# Patient Record
Sex: Female | Born: 2005 | Race: White | Hispanic: No | Marital: Single | Smoking: Never smoker
Health system: Southern US, Community
[De-identification: ages and names within clinical notes are randomized; demographics above are authoritative.]

## PROBLEM LIST (undated history)

## (undated) DIAGNOSIS — R5383 Other fatigue: Secondary | ICD-10-CM

## (undated) DIAGNOSIS — R42 Dizziness and giddiness: Secondary | ICD-10-CM

## (undated) DIAGNOSIS — R110 Nausea: Secondary | ICD-10-CM

## (undated) DIAGNOSIS — R200 Anesthesia of skin: Secondary | ICD-10-CM

## (undated) DIAGNOSIS — K648 Other hemorrhoids: Secondary | ICD-10-CM

## (undated) DIAGNOSIS — K0510 Chronic gingivitis, plaque induced: Secondary | ICD-10-CM

## (undated) DIAGNOSIS — R55 Syncope and collapse: Secondary | ICD-10-CM

## (undated) DIAGNOSIS — R480 Dyslexia and alexia: Secondary | ICD-10-CM

## (undated) DIAGNOSIS — K0889 Other specified disorders of teeth and supporting structures: Secondary | ICD-10-CM

## (undated) DIAGNOSIS — Z68.41 Body mass index (BMI) pediatric, 5th percentile to less than 85th percentile for age: Secondary | ICD-10-CM

## (undated) HISTORY — DX: Dyslexia and alexia: R48.0

## (undated) HISTORY — DX: Nausea: R11.0

## (undated) HISTORY — DX: Other hemorrhoids: K64.8

## (undated) HISTORY — DX: Paresthesia of skin: R20.0

## (undated) HISTORY — DX: Dizziness and giddiness: R42

## (undated) HISTORY — DX: Body mass index (BMI) pediatric, 5th percentile to less than 85th percentile for age: Z68.52

## (undated) HISTORY — DX: Other fatigue: R53.83

## (undated) HISTORY — DX: Syncope and collapse: R55

---
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
## 2024-01-06 ENCOUNTER — Encounter: Payer: Self-pay | Admitting: Gastroenterology

## 2024-01-06 ENCOUNTER — Ambulatory Visit: Payer: 59 | Admitting: Gastroenterology

## 2024-01-06 VITALS — BP 108/62 | HR 89 | Ht 68.0 in | Wt 135.4 lb

## 2024-01-06 DIAGNOSIS — K5900 Constipation, unspecified: Secondary | ICD-10-CM | POA: Diagnosis not present

## 2024-01-06 DIAGNOSIS — K625 Hemorrhage of anus and rectum: Secondary | ICD-10-CM | POA: Diagnosis not present

## 2024-01-06 MED ORDER — NA SULFATE-K SULFATE-MG SULF 17.5-3.13-1.6 GM/177ML PO SOLN: 1.0000 | Freq: Once | ORAL | 0 refills | Status: AC

## 2024-01-06 NOTE — Patient Instructions (Signed)
 You have been scheduled for a colonoscopy. Please follow written instructions given to you at your visit today.   Please pick up your prep supplies at the pharmacy within the next 1-3 days.  If you use inhalers (even only as needed), please bring them with you on the day of your procedure.  DO NOT TAKE 7 DAYS PRIOR TO TEST- Trulicity (dulaglutide) Ozempic, Wegovy (semaglutide) Mounjaro (tirzepatide) Bydureon Bcise (exanatide extended release)  DO NOT TAKE 1 DAY PRIOR TO YOUR TEST Rybelsus (semaglutide) Adlyxin (lixisenatide) Victoza (liraglutide) Byetta (exanatide) ______________________________________________________________________  _______________________________________________________  If your blood pressure at your visit was 140/90 or greater, please contact your primary care physician to follow up on this.  _______________________________________________________  If you are age 57 or older, your body mass index should be between 23-30. Your Body mass index is 20.58 kg/m. If this is out of the aforementioned range listed, please consider follow up with your Primary Care Provider.  If you are age 62 or younger, your body mass index should be between 19-25. Your Body mass index is 20.58 kg/m. If this is out of the aformentioned range listed, please consider follow up with your Primary Care Provider.   ________________________________________________________  The [facility] providers would like to encourage you to use MYCHART to communicate with providers for non-urgent requests or questions.  Due to long hold times on the telephone, sending your provider a message by [facility] may be a faster and more efficient way to get a response.  Please allow 48 business hours for a response.  Please remember that this is for non-urgent requests.  _______________________________________________________

## 2024-01-06 NOTE — Progress Notes (Addendum)
 01/06/2024 Yolanda Black [id number] 29-Jan-2006   HISTORY OF PRESENT ILLNESS: This is an 18 year old female who is new to our office.  She was referred here by Dr. Melvin for evaluation of bloody stool.  She is here today with her mother.  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  She tells me that she has been having issues with rectal bleeding since June.  Says that often times she has constipation with hard stools and has to sit for 5 minutes or so in order to go.  Since June she has had blood with every bowel movement, discomfort in her bottom, says that the blood is in the toilet bowl.  Her mother states that it looks like a advertising account executive.  Denies any abdominal pain.   Past Medical History:  Diagnosis Date   Gingivitis 01/2015   Tooth loose 02/02/2015   lower left   Past Surgical History:  Procedure Laterality Date   ALVEOLOPLASTY N/A 02/10/2015   Procedure: GINGIVECTOMY;  Surgeon: Aureliano Alm Maizes, DDS;  Location: [facility];  Service: Dentistry;  Laterality: N/A;    reports that she has never smoked. She has never used smokeless tobacco. She reports that she does not drink alcohol and does not use drugs. family history includes Asthma in her sister. Allergies  Allergen Reactions   Beeswax Hives      Outpatient Encounter Medications as of 01/06/2024  Medication Sig   fexofenadine (ALLEGRA ALLERGY) 180 MG tablet Take 180 mg by mouth as needed.   fluticasone (FLONASE ALLERGY RELIEF) 50 MCG/ACT nasal spray Place 1 spray into both nostrils as needed.   medroxyPROGESTERone  Acetate 150 MG/ML SUSY Inject 1 mL into the muscle as needed (every 3 months).   EPINEPHrine  (AUVI-Q ) 0.3 mg/0.3 mL IJ SOAJ injection Inject 0.3 mg into the muscle as needed. (Patient not taking: Reported on 01/06/2024)   mupirocin  ointment (BACTROBAN ) 2 % Apply 1 application topically 2 (two) times daily. (Patient not taking: Reported on 01/06/2024)   nystatin   cream (MYCOSTATIN ) Apply 1 application topically 3 (three) times daily. Use until completely resolved for < 48 hrs (Patient not taking: Reported on 01/06/2024)   [DISCONTINUED] fluconazole  (DIFLUCAN ) 10 MG/ML suspension Take 28.5 mLs (285 mg total) by mouth once. Repeat in 3 days. (Patient not taking: Reported on 01/06/2024)   [DISCONTINUED] trimethoprim -polymyxin b  (POLYTRIM ) ophthalmic solution Place 1 drop into the right eye every 4 (four) hours. X 7 days   No facility-administered encounter medications on file as of 01/06/2024.    REVIEW OF SYSTEMS  : All other systems reviewed and negative except where noted in the History of Present Illness.   PHYSICAL EXAM: BP 108/62   Pulse 89   Ht 5' 8 (1.727 m)   Wt 135 lb 6 oz (61.4 kg)   LMP 12/16/2023 (Within Days)   SpO2 98%   BMI 20.58 kg/m  General: Well developed white female in no acute distress Head: Normocephalic and atraumatic Eyes:  Sclerae anicteric, conjunctiva pink. Ears: Normal auditory acuity Lungs: Clear throughout to auscultation; no W/R/R. Heart: Regular rate and rhythm; no M/R/G. Abdomen: Soft, non-distended.  BS present.  Non-tender. Rectal:  Will be done at the time of colonoscopy. Musculoskeletal: Symmetrical with no gross deformities  Skin: No lesions on visible extremities Extremities: No edema  Neurological: Alert oriented x 4, grossly non-focal Psychological:  Alert and cooperative. Normal mood and affect  ASSESSMENT AND PLAN: *Rectal bleeding: Having hard,  painful bowel movements with associated rectal bleeding since June.  Every bowel movement has been bloody, blood in the toilet bowl, etc. since then.  Initially sounded like a fissure, but bleeding sounds more than what you expect from that.  Will plan for colonoscopy with Dr. Suzann next week.  Pending results likely will need treatment of her underlying constipation.  The risks, benefits, and alternatives to colonoscopy were discussed with the patient and she  consents to proceed.    CC:  Arlys Rogue, MD CC: Dr. Seena  I have reviewed the clinic note as outlined by Harlene Mail, PA and agree with the assessment, plan and medical decision making.  [name] has had rectal bleeding in conjunction with hemorrhoids since June 2024.  The differential diagnosis consists of anal fissure, hemorrhoids, proctitis, colon polyp, AVM.  Agree with proceeding with colonoscopy and further recommendations can be made pending that result.  Consider future labs including CBC and iron studies given protracted nature of bleeding.  Inocente Suzann, MD [facility]

## 2024-01-07 ENCOUNTER — Encounter: Payer: Self-pay | Admitting: Pediatrics

## 2024-01-12 ENCOUNTER — Ambulatory Visit (AMBULATORY_SURGERY_CENTER): Payer: 59 | Admitting: Pediatrics

## 2024-01-12 ENCOUNTER — Encounter: Payer: Self-pay | Admitting: Pediatrics

## 2024-01-12 VITALS — BP 110/60 | HR 73 | Temp 98.4°F | Resp 14 | Ht 68.0 in | Wt 135.0 lb

## 2024-01-12 DIAGNOSIS — D125 Benign neoplasm of sigmoid colon: Secondary | ICD-10-CM

## 2024-01-12 DIAGNOSIS — K648 Other hemorrhoids: Secondary | ICD-10-CM

## 2024-01-12 DIAGNOSIS — K6289 Other specified diseases of anus and rectum: Secondary | ICD-10-CM

## 2024-01-12 DIAGNOSIS — K635 Polyp of colon: Secondary | ICD-10-CM | POA: Diagnosis not present

## 2024-01-12 DIAGNOSIS — K625 Hemorrhage of anus and rectum: Secondary | ICD-10-CM | POA: Diagnosis not present

## 2024-01-12 DIAGNOSIS — K5900 Constipation, unspecified: Secondary | ICD-10-CM

## 2024-01-12 MED ORDER — SODIUM CHLORIDE 0.9 % IV SOLN: 500.0000 mL | Freq: Once | INTRAVENOUS | Status: DC

## 2024-01-12 NOTE — Patient Instructions (Addendum)
 Return to GI clinic as previously scheduled.  Await pathology  Please read over handouts provided   YOU HAD AN ENDOSCOPIC PROCEDURE TODAY AT THE [facility]:   Refer to the procedure report that was given to you for any specific questions about what was found during the examination.  If the procedure report does not answer your questions, please call your gastroenterologist to clarify.  If you requested that your care partner not be given the details of your procedure findings, then the procedure report has been included in a sealed envelope for you to review at your convenience later.  YOU SHOULD EXPECT: Some feelings of bloating in the abdomen. Passage of more gas than usual.  Walking can help get rid of the air that was put into your GI tract during the procedure and reduce the bloating. If you had a lower endoscopy (such as a colonoscopy or flexible sigmoidoscopy) you may notice spotting of blood in your stool or on the toilet paper. If you underwent a bowel prep for your procedure, you may not have a normal bowel movement for a few days.  Please Note:  You might notice some irritation and congestion in your nose or some drainage.  This is from the oxygen used during your procedure.  There is no need for concern and it should clear up in a day or so.  SYMPTOMS TO REPORT IMMEDIATELY:  Following lower endoscopy (colonoscopy or flexible sigmoidoscopy):  Excessive amounts of blood in the stool  Significant tenderness or worsening of abdominal pains  Swelling of the abdomen that is new, acute  Fever of 100F or higher For urgent or emergent issues, a gastroenterologist can be reached at any hour by calling [phone]. Do not use MyChart messaging for urgent concerns.    DIET:  We do recommend a small meal at first, but then you may proceed to your regular diet.  Drink plenty of fluids but you should avoid alcoholic beverages for 24 hours.  ACTIVITY:  You should plan to take  it easy for the rest of today and you should NOT DRIVE or use heavy machinery until tomorrow (because of the sedation medicines used during the test).    FOLLOW UP: Our staff will call the number listed on your records the next business day following your procedure.  We will call around 7:15- 8:00 am to check on you and address any questions or concerns that you may have regarding the information given to you following your procedure. If we do not reach you, we will leave a message.     If any biopsies were taken you will be contacted by phone or by letter within the next 1-3 weeks.  Please call us  at [phone] if you have not heard about the biopsies in 3 weeks.    SIGNATURES/CONFIDENTIALITY: You and/or your care partner have signed paperwork which will be entered into your electronic medical record.  These signatures attest to the fact that that the information above on your After Visit Summary has been reviewed and is understood.  Full responsibility of the confidentiality of this discharge information lies with you and/or your care-partner.

## 2024-01-12 NOTE — Progress Notes (Signed)
 Pt's states no medical or surgical changes since previsit or office visit.

## 2024-01-12 NOTE — Progress Notes (Signed)
 Called to room to assist during endoscopic procedure.  Patient ID and intended procedure confirmed with present staff. Received instructions for my participation in the procedure from the performing physician.

## 2024-01-12 NOTE — Progress Notes (Signed)
 Pt resting comfortably. VSS. Airway intact. SBAR complete to RN. All questions answered.

## 2024-01-12 NOTE — Op Note (Signed)
 [facility] Patient Name: Yolanda Black Procedure Date: 01/12/2024 8:08 AM MRN: [id number] Endoscopist: Inocente Hausen , MD,  Age: 18 Referring MD:  Date of Birth: 04-21-2006 Gender: Female Account #: [id number] Procedure:                Colonoscopy Indications:              This is the patient's first colonoscopy,                            Hematochezia, Rectal bleeding, Constipation Medicines:                Monitored Anesthesia Care Procedure:                Pre-Anesthesia Assessment:                           - Prior to the procedure, a History and Physical                            was performed, and patient medications and                            allergies were reviewed. The patient's tolerance of                            previous anesthesia was also reviewed. The risks                            and benefits of the procedure and the sedation                            options and risks were discussed with the patient.                            All questions were answered, and informed consent                            was obtained. Prior Anticoagulants: The patient has                            taken no anticoagulant or antiplatelet agents. ASA                            Grade Assessment: I - A normal, healthy patient.                            After reviewing the risks and benefits, the patient                            was deemed in satisfactory condition to undergo the                            procedure.  After obtaining informed consent, the colonoscope                            was passed under direct vision. Throughout the                            procedure, the patient's blood pressure, pulse, and                            oxygen saturations were monitored continuously. The                            Olympus Scope SN: [id number] was introduced through                            the anus and advanced to the the terminal  ileum.                            The colonoscopy was performed without difficulty.                            The patient tolerated the procedure well. The                            quality of the bowel preparation was evaluated                            using the BBPS [facility] Bowel Preparation Scale)                            with scores of: Right Colon = 3, Transverse Colon =                            3 and Left Colon = 3 (entire mucosa seen well with                            no residual staining, small fragments of stool or                            opaque liquid). The total BBPS score equals 9. Scope In: 8:20:24 AM Scope Out: 8:46:06 AM Scope Withdrawal Time: 0 hours 21 minutes 12 seconds  Total Procedure Duration: 0 hours 25 minutes 42 seconds  Findings:                 The perianal and digital rectal examinations were                            normal. Pertinent negatives include normal                            sphincter tone and no palpable rectal lesions.  A patchy area of mildly erythematous mucosa was                            found in the rectum. Biopsies were taken with a                            cold forceps for histology.                           The sigmoid colon, descending colon, transverse                            colon, ascending colon and cecum appeared normal.                            Biopsies were taken with a cold forceps for                            histology.                           A 3 mm polyp was found in the sigmoid colon. The                            polyp was sessile. The polyp was removed with a                            cold biopsy forceps. Resection and retrieval were                            complete.                           The terminal ileum appeared normal.                           Internal hemorrhoids were found during retroflexion. Complications:            No immediate complications. Estimated  blood loss:                            Minimal. Estimated Blood Loss:     Estimated blood loss was minimal. Estimated blood                            loss was minimal. Impression:               - Erythematous mucosa in the rectum. Biopsied.                           - The sigmoid colon, descending colon, transverse                            colon, ascending colon and cecum are normal.  Biopsied.                           - One 3 mm polyp in the sigmoid colon, removed with                            a cold biopsy forceps. Resected and retrieved.                           - The examined portion of the ileum was normal.                           - Internal hemorrhoids. Recommendation:           - Discharge patient to home (ambulatory).                           - Await pathology results.                           - The findings and recommendations were discussed                            with the patient's family.                           - Return to GI clinic as previously scheduled.                           - Patient has a contact number available for                            emergencies. The signs and symptoms of potential                            delayed complications were discussed with the                            patient. Return to normal activities tomorrow.                            Written discharge instructions were provided to the                            patient. Inocente Hausen, MD 01/12/2024 8:52:44 AM

## 2024-01-12 NOTE — Progress Notes (Signed)
 [facility] History and Physical   Primary Care Physician:  Arlys Rogue, MD   Reason for Procedure:  Rectal bleeding, hematochezia, constipation  Plan:    Diagnostic colonoscopy     HPI: Mayelin Panos is a 18 y.o. female undergoing diagnostic colonoscopy for a 79-month history of rectal bleeding, hematochezia and constipation.  No abdominal pain.   Past Medical History:  Diagnosis Date   Gingivitis 01/2015   Tooth loose 02/02/2015   lower left    Past Surgical History:  Procedure Laterality Date   ALVEOLOPLASTY N/A 02/10/2015   Procedure: GINGIVECTOMY;  Surgeon: Aureliano Alm Maizes, DDS;  Location: [facility];  Service: Dentistry;  Laterality: N/A;    Prior to Admission medications   Medication Sig Start Date End Date Taking? Authorizing Provider  EPINEPHrine  (AUVI-Q ) 0.3 mg/0.3 mL IJ SOAJ injection Inject 0.3 mg into the muscle as needed. Patient not taking: Reported on 01/06/2024    [provider]  fexofenadine (ALLEGRA ALLERGY) 180 MG tablet Take 180 mg by mouth as needed.    [provider]  fluticasone (FLONASE ALLERGY RELIEF) 50 MCG/ACT nasal spray Place 1 spray into both nostrils as needed.    [provider]  medroxyPROGESTERone  Acetate 150 MG/ML SUSY Inject 1 mL into the muscle as needed (every 3 months). 10/30/23   [provider]  mupirocin  ointment (BACTROBAN ) 2 % Apply 1 application topically 2 (two) times daily. Patient not taking: Reported on 01/06/2024 05/18/16   Loreli Elyn SAILOR, MD  nystatin  cream (MYCOSTATIN ) Apply 1 application topically 3 (three) times daily. Use until completely resolved for < 48 hrs Patient not taking: Reported on 01/06/2024 05/18/16   Loreli Elyn SAILOR, MD    Current Outpatient Medications  Medication Sig Dispense Refill   EPINEPHrine  (AUVI-Q ) 0.3 mg/0.3 mL IJ SOAJ injection Inject 0.3 mg into the muscle as needed. (Patient not taking: Reported on 01/06/2024)     fexofenadine (ALLEGRA  ALLERGY) 180 MG tablet Take 180 mg by mouth as needed.     fluticasone (FLONASE ALLERGY RELIEF) 50 MCG/ACT nasal spray Place 1 spray into both nostrils as needed.     medroxyPROGESTERone  Acetate 150 MG/ML SUSY Inject 1 mL into the muscle as needed (every 3 months).     mupirocin  ointment (BACTROBAN ) 2 % Apply 1 application topically 2 (two) times daily. (Patient not taking: Reported on 01/12/2024) 30 g 1   nystatin  cream (MYCOSTATIN ) Apply 1 application topically 3 (three) times daily. Use until completely resolved for < 48 hrs (Patient not taking: Reported on 01/12/2024) 60 g 1   Current Facility-Administered Medications  Medication Dose Route Frequency Provider Last Rate Last Admin   0.9 %  sodium chloride  infusion  500 mL Intravenous Once Suzann Inocente HERO, MD        Allergies as of 01/12/2024 - Review Complete 01/12/2024  Allergen Reaction Noted   Beeswax Hives 02/02/2015    Family History  Problem Relation Age of Onset   Asthma Sister        smog-related   Colon cancer Neg Hx    Esophageal cancer Neg Hx    Stomach cancer Neg Hx    Rectal cancer Neg Hx     Social History   Socioeconomic History   Marital status: Single    Spouse name: Not on file   Number of children: Not on file   Years of education: Not on file   Highest education level: Not on file  Occupational History   Not on  file  Tobacco Use   Smoking status: Never   Smokeless tobacco: Never  Vaping Use   Vaping status: Never Used  Substance and Sexual Activity   Alcohol use: No   Drug use: No   Sexual activity: Not on file    Comment: Depo shot on 11/21/2023  Other Topics Concern   Not on file  Social History Narrative   Not on file   Social Drivers of Health   Financial Resource Strain: Not on file  Food Insecurity: Not on file  Transportation Needs: Not on file  Physical Activity: Not on file  Stress: Not on file  Social Connections: Not on file  Intimate Partner Violence: Not on file     Review of Systems:  All other review of systems negative except as mentioned in the HPI.  Physical Exam: Vital signs BP 112/73   Pulse 92   Temp 98.4 F (36.9 C) (Temporal)   Resp 14   Ht 5' 8 (1.727 m)   Wt 135 lb (61.2 kg)   LMP 01/12/2024 (Exact Date)   SpO2 100%   BMI 20.53 kg/m   General:   Alert,  Well-developed, well-nourished, pleasant and cooperative in NAD Airway:  Mallampati 2 Lungs:  Clear throughout to auscultation.   Heart:  Regular rate and rhythm; no murmurs, clicks, rubs,  or gallops. Abdomen:  Soft, nontender and nondistended. Normal bowel sounds.   Neuro/Psych:  Normal mood and affect. A and O x 3  Inocente Hausen, MD [facility]

## 2024-01-13 ENCOUNTER — Telehealth: Payer: Self-pay

## 2024-01-13 NOTE — Telephone Encounter (Signed)
  Follow up Call-     01/12/2024    7:37 AM  Call back number  Post procedure Call Back phone  # [phone]  Permission to leave phone message Yes     Patient questions:  Do you have a fever, pain , or abdominal swelling? No. Pain Score  0 *  Have you tolerated food without any problems? Yes.    Have you been able to return to your normal activities? Yes.    Do you have any questions about your discharge instructions: Diet   No. Medications  No. Follow up visit  No.  Do you have questions or concerns about your Care? No.  Actions: * If pain score is 4 or above: No action needed, pain <4.

## 2024-01-14 LAB — SURGICAL PATHOLOGY

## 2024-01-15 ENCOUNTER — Encounter: Payer: Self-pay | Admitting: Pediatrics

## 2024-01-30 NOTE — Progress Notes (Deleted)
 [facility] Return Visit   Referring Provider Aggie Hacker, MD [address] [place],  [place] [address]  Primary Care Provider Aggie Hacker, MD  Patient Profile: Yolanda Black is a 18 y.o. female who returns to the [facility] for follow-up of the problem(s) noted below.  Problem List: Rectal bleeding, hematochezia 2/2 internal hemorrhoids Constipation   History of Present Illness   Yolanda Black was last seen in the GI office 01/06/2024 by Doug Sou, PA   Current GI Meds  None  Interval History  Since Yolanda Black last clinic visit she underwent colonoscopy 01/12/2024 for investigation of rectal bleeding, hematochezia and constipation.  Colonoscopy was overall normal -erythematous mucosa was noted in the rectum with biopsies negative for proctitis; a 3 mm hyperplastic polyp was resected in the sigmoid colon; internal hemorrhoids were noted which were felt to be the source of her hematochezia and rectal bleeding  Last colonoscopy: 01/19/2024 - as above in HPI Last endoscopy: None  Last Abd CT/CTE/MRE: None  GI Review of Symptoms Significant for {GIROS:50592}. Otherwise negative.  General Review of Systems  Review of systems is significant for the pertinent positives and negatives as listed per the HPI.  Full ROS is otherwise negative.  Past Medical History   Past Medical History:  Diagnosis Date   Gingivitis 01/2015   Tooth loose 02/02/2015   lower left     Past Surgical History   Past Surgical History:  Procedure Laterality Date   ALVEOLOPLASTY N/A 02/10/2015   Procedure: GINGIVECTOMY;  Surgeon: Montel Clock, DDS;  Location: [facility];  Service: Dentistry;  Laterality: N/A;     Allergies and Medications   Allergies  Allergen Reactions   Beeswax Hives    @MEDSTODAY @  Family History   Family History  Problem Relation Age of Onset   Asthma Sister        smog-related   Colon cancer Neg Hx    Esophageal  cancer Neg Hx    Stomach cancer Neg Hx    Rectal cancer Neg Hx     Social History   Social History   Tobacco Use   Smoking status: Never   Smokeless tobacco: Never  Vaping Use   Vaping status: Never Used  Substance Use Topics   Alcohol use: No   Drug use: No   Zaley reports that she has never smoked. She has never used smokeless tobacco. She reports that she does not drink alcohol and does not use drugs.  Vital Signs and Physical Examination  There were no vitals filed for this visit. There is no height or weight on file to calculate BMI.    General: Well developed, well nourished, no acute distress Head: Normocephalic and atraumatic Eyes: Sclerae anicteric, EOMI Ears: Normal auditory acuity Mouth: No deformities or lesions noted Lungs: Clear throughout to auscultation Heart: Regular rate and rhythm; No murmurs, rubs or bruits Abdomen: Soft, non tender and non distended. No masses, hepatosplenomegaly or hernias noted. Normal Bowel sounds Rectal: Musculoskeletal: Symmetrical with no gross deformities  Pulses:  Normal pulses noted Extremities: No edema or deformities noted Neurological: Alert oriented x 4, grossly nonfocal Psychological:  Alert and cooperative. Normal mood and affect   Review of Data  The following data was reviewed at the time of this encounter:  Laboratory Studies   None   Imaging Studies  None   GI Procedures and Studies  Colonoscopy 01/12/2024 - Erythematous mucosa in the rectum. Biopsied.  - The sigmoid colon, descending colon,  transverse colon, ascending colon and cecum are normal. Biopsied.  - One 3 mm polyp in the sigmoid colon, removed with a cold biopsy forceps. Resected and retrieved.  - The examined portion of the ileum was normal. - Internal hemorrhoids.   Path:   1. Surgical [P], right colon bxs :      - COLONIC MUCOSA WITH NO SPECIFIC HISTOPATHOLOGIC CHANGES      - NEGATIVE FOR ACUTE INFLAMMATION, FEATURES OF CHRONICITY,  GRANULOMAS OR      DYSPLASIA       2. Surgical [P], left colon bxs :      - COLONIC MUCOSA WITH NO SPECIFIC HISTOPATHOLOGIC CHANGES      - NEGATIVE FOR ACUTE INFLAMMATION, FEATURES OF CHRONICITY, GRANULOMAS OR      DYSPLASIA       3. Surgical [P], colon, sigmoid, polyp (1) :      - HYPERPLASTIC POLYP       4. Surgical [P], colon, rectum :      - RECTAL MUCOSA WITH NO SPECIFIC HISTOPATHOLOGIC CHANGES      - NEGATIVE FOR ACUTE INFLAMMATION, FEATURES OF CHRONICITY, GRANULOMAS OR      DYSPLASIA   Clinical Impression  It is my clinical impression that Yolanda Black is a 18 y.o. female with;  Rectal bleeding, hematochezia secondary to internal hemorrhoids Constipation  Yolanda Black was initially evaluated in January 2025 for a 13-month history of rectal bleeding in association with constipation and straining.  Colonoscopy performed that month was overall unremarkable showing a diminutive hyperplastic polyp and internal hemorrhoids.  No evidence of IBD or proctitis.  She was advised regarding the use of laxatives and stool softeners to ameliorate constipation and reduce the chance of irritating internal hemorrhoids.  Plan  *** *** *** *** ***   Planned Follow Up No follow-ups on file.  The patient or caregiver verbalized understanding of the material covered, with no barriers to understanding. All questions were answered. Patient or caregiver is agreeable with the plan outlined above.    It was a pleasure to see Yolanda Black.  If you have any questions or concerns regarding this evaluation, do not hesitate to contact me.  Maren Beach, MD [facility]

## 2024-02-09 ENCOUNTER — Telehealth: Payer: Self-pay | Admitting: Pediatrics

## 2024-02-09 NOTE — Telephone Encounter (Signed)
 Spoke with patient's mother, Nellie Banas (ok per DPR). She states that patient continues to have problems with constipation but is trying to make dietary changes ect. She does need to reschedule tomorrows appointment because patient has a 2 pm dentist appt. Dr Jarold Merlin appointment rescheduled to next availability on 03/05/24.

## 2024-02-09 NOTE — Telephone Encounter (Signed)
 Inbound call from patient's mother requesting to know if patient needs to come in for appointment tomorrow 2/11. States the results of colonoscopy came back negative. Requesting a call back to be advised. Please advise, thank you.

## 2024-02-10 ENCOUNTER — Ambulatory Visit: Payer: 59 | Admitting: Pediatrics

## 2024-02-10 DIAGNOSIS — K625 Hemorrhage of anus and rectum: Secondary | ICD-10-CM

## 2024-02-10 DIAGNOSIS — K5900 Constipation, unspecified: Secondary | ICD-10-CM

## 2024-02-17 DIAGNOSIS — M25571 Pain in right ankle and joints of right foot: Secondary | ICD-10-CM | POA: Diagnosis not present

## 2024-03-04 NOTE — Progress Notes (Deleted)
 [facility] Return Visit   Referring Provider Aggie Hacker, MD [address] [place],  [place] [address]  Primary Care Provider Aggie Hacker, MD  Patient Profile: Yolanda Black is a 18 y.o. female who returns to the [facility] for follow-up of the problem(s) noted below.  Problem List: Chronic constipation Hematochezia secondary to internal hemorrhoids   History of Present Illness   Ms. Glantz was last seen in the GI office 01/06/2024 by Doug Sou, PA   Current GI Meds    Interval History  Subsequent to Yolanda Black's last visit she underwent colonoscopy which was overall normal.  A 3 mm hyperplastic polyp was removed from the sigmoid colon.  Internal hemorrhoids were noted on retroflexion.  Based upon the benign findings, I attributed her rectal bleeding to constipation and internal hemorrhoids.  Last colonoscopy:  12/2023 - Overall normal colon and TI, 3 mm HP in [place], internal hemorrhoids  Last endoscopy:  None  Last Abd CT/CTE/MRE: None  GI Review of Symptoms Significant for {GIROS:50592}. Otherwise negative.  General Review of Systems  Review of systems is significant for the pertinent positives and negatives as listed per the HPI.  Full ROS is otherwise negative.  Past Medical History   Past Medical History:  Diagnosis Date   Gingivitis 01/2015   Tooth loose 02/02/2015   lower left     Past Surgical History   Past Surgical History:  Procedure Laterality Date   ALVEOLOPLASTY N/A 02/10/2015   Procedure: GINGIVECTOMY;  Surgeon: Montel Clock, DDS;  Location: [facility];  Service: Dentistry;  Laterality: N/A;     Allergies and Medications   Allergies  Allergen Reactions   Beeswax Hives    @MEDSTODAY @  Family History   Family History  Problem Relation Age of Onset   Asthma Sister        smog-related   Colon cancer Neg Hx    Esophageal cancer Neg Hx    Stomach cancer Neg Hx    Rectal cancer Neg Hx     GI Specific Family History: {gifamhx:50061}   Social History   Social History   Tobacco Use   Smoking status: Never   Smokeless tobacco: Never  Vaping Use   Vaping status: Never Used  Substance Use Topics   Alcohol use: No   Drug use: No   Dulcie reports that she has never smoked. She has never used smokeless tobacco. She reports that she does not drink alcohol and does not use drugs.  Vital Signs and Physical Examination  There were no vitals filed for this visit. There is no height or weight on file to calculate BMI.    General: Well developed, well nourished, no acute distress Head: Normocephalic and atraumatic Eyes: Sclerae anicteric, EOMI Ears: Normal auditory acuity Mouth: No deformities or lesions noted Lungs: Clear throughout to auscultation Heart: Regular rate and rhythm; No murmurs, rubs or bruits Abdomen: Soft, non tender and non distended. No masses, hepatosplenomegaly or hernias noted. Normal Bowel sounds Rectal: Musculoskeletal: Symmetrical with no gross deformities  Pulses:  Normal pulses noted Extremities: No edema or deformities noted Neurological: Alert oriented x 4, grossly nonfocal Psychological:  Alert and cooperative. Normal mood and affect   Review of Data  The following data was reviewed at the time of this encounter:  Laboratory Studies       No data to display          No results found for: "LIPASE"     No data  to display           Imaging Studies    GI Procedures and Studies  Colonoscopy 12/2023 Overall normal colon and TI, 3 mm HP in [place], internal hemorrhoids    Clinical Impression  It is my clinical impression that Ms. Boxx is a 18 y.o. female with;  ***  Plan  *** *** *** *** ***   Planned Follow Up No follow-ups on file.  The patient or caregiver verbalized understanding of the material covered, with no barriers to understanding. All questions were answered. Patient or caregiver is agreeable with  the plan outlined above.    It was a pleasure to see Yolanda Black.  If you have any questions or concerns regarding this evaluation, do not hesitate to contact me.  Maren Beach, MD [facility]

## 2024-03-05 ENCOUNTER — Ambulatory Visit: Payer: 59 | Admitting: Pediatrics

## 2024-03-05 DIAGNOSIS — K625 Hemorrhage of anus and rectum: Secondary | ICD-10-CM

## 2024-03-05 DIAGNOSIS — K649 Unspecified hemorrhoids: Secondary | ICD-10-CM

## 2024-03-05 DIAGNOSIS — K5900 Constipation, unspecified: Secondary | ICD-10-CM

## 2024-03-18 DIAGNOSIS — R55 Syncope and collapse: Secondary | ICD-10-CM | POA: Diagnosis not present

## 2024-03-18 DIAGNOSIS — R200 Anesthesia of skin: Secondary | ICD-10-CM | POA: Diagnosis not present

## 2024-03-18 DIAGNOSIS — Z7182 Exercise counseling: Secondary | ICD-10-CM | POA: Diagnosis not present

## 2024-03-18 DIAGNOSIS — R5383 Other fatigue: Secondary | ICD-10-CM | POA: Diagnosis not present

## 2024-03-18 DIAGNOSIS — R42 Dizziness and giddiness: Secondary | ICD-10-CM | POA: Diagnosis not present

## 2024-03-18 DIAGNOSIS — R202 Paresthesia of skin: Secondary | ICD-10-CM | POA: Diagnosis not present

## 2024-03-18 DIAGNOSIS — Z0000 Encounter for general adult medical examination without abnormal findings: Secondary | ICD-10-CM | POA: Diagnosis not present

## 2024-03-18 DIAGNOSIS — Z113 Encounter for screening for infections with a predominantly sexual mode of transmission: Secondary | ICD-10-CM | POA: Diagnosis not present

## 2024-03-18 DIAGNOSIS — Z68.41 Body mass index (BMI) pediatric, 5th percentile to less than 85th percentile for age: Secondary | ICD-10-CM | POA: Diagnosis not present

## 2024-03-18 DIAGNOSIS — Z713 Dietary counseling and surveillance: Secondary | ICD-10-CM | POA: Diagnosis not present

## 2024-03-26 DIAGNOSIS — R209 Unspecified disturbances of skin sensation: Secondary | ICD-10-CM | POA: Diagnosis not present

## 2024-03-26 DIAGNOSIS — R42 Dizziness and giddiness: Secondary | ICD-10-CM | POA: Diagnosis not present

## 2024-03-26 DIAGNOSIS — R5383 Other fatigue: Secondary | ICD-10-CM | POA: Diagnosis not present

## 2024-03-26 DIAGNOSIS — R55 Syncope and collapse: Secondary | ICD-10-CM | POA: Diagnosis not present

## 2024-04-12 ENCOUNTER — Other Ambulatory Visit: Payer: Self-pay | Admitting: Pediatrics

## 2024-04-12 ENCOUNTER — Encounter: Payer: Self-pay | Admitting: Pediatrics

## 2024-04-12 DIAGNOSIS — E162 Hypoglycemia, unspecified: Secondary | ICD-10-CM

## 2024-04-12 DIAGNOSIS — R1011 Right upper quadrant pain: Secondary | ICD-10-CM | POA: Diagnosis not present

## 2024-04-12 DIAGNOSIS — R17 Unspecified jaundice: Secondary | ICD-10-CM | POA: Diagnosis not present

## 2024-04-14 ENCOUNTER — Ambulatory Visit
Admission: RE | Admit: 2024-04-14 | Discharge: 2024-04-14 | Disposition: A | Discharge status: Home / Self Care | Source: Ambulatory Visit | Attending: Pediatrics | Admitting: Pediatrics

## 2024-04-14 DIAGNOSIS — E162 Hypoglycemia, unspecified: Secondary | ICD-10-CM

## 2024-04-14 DIAGNOSIS — R1011 Right upper quadrant pain: Secondary | ICD-10-CM | POA: Diagnosis not present

## 2024-06-10 NOTE — Progress Notes (Signed)
 Cardiology Office Note:    Date:  06/14/2024   ID:  Arlo Berber, DOB 2006-12-27, MRN [id number]  PCP:  Candelaria Chaco, MD   [facility] Providers Cardiologist:  None     Referring MD: Candelaria Chaco, MD   Chief Complaint  Patient presents with   Dizziness    History of Present Illness:    Yolanda Black is a 18 y.o. female is seen at the request of Dr Anell Keep for evaluation of dizziness/ pre syncope. 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 While driving home she felt lightheaded and couldn't feel her arms and legs. She pulled over and rested. Noted she was going in and out. Laid down in the back seat of her car and elevated her legs with improvement. Felt like HR was fast. Not really clear that she passed out completely. She did feel some nausea. No sweats. Since then when sitting for a long time she has felt similar but not as severe. She is generally in excellent health. Eats regularly. Exercises some. Getting ready to go to [place]  for college.   Past Medical History:  Diagnosis Date   BMI (body mass index), pediatric, 5% to less than 85% for age    Dizziness    Dizzy    Dyslexia    Fatigue    Gingivitis 01/30/2015   Internal hemorrhoid    Lightheadedness    Nauseous    Numbness and tingling    Syncope    Syncope and collapse    Tooth loose 02/02/2015   lower left    Past Surgical History:  Procedure Laterality Date   ALVEOLOPLASTY N/A 02/10/2015   Procedure: GINGIVECTOMY;  Surgeon: Ashby Lawman, DDS;  Location: [facility];  Service: Dentistry;  Laterality: N/A;    Current Medications: Current Meds  Medication Sig   EPINEPHrine  (AUVI-Q ) 0.3 mg/0.3 mL IJ SOAJ injection Inject 0.3 mg into the muscle as needed.   fexofenadine (ALLEGRA ALLERGY) 180 MG tablet Take 180 mg by mouth as needed.   fluticasone (FLONASE ALLERGY RELIEF) 50 MCG/ACT nasal spray Place 1 spray into both nostrils as needed.     Allergies:   Beeswax    Social History   Socioeconomic History   Marital status: Single    Spouse name: Not on file   Number of children: Not on file   Years of education: Not on file   Highest education level: Not on file  Occupational History   Not on file  Tobacco Use   Smoking status: Never   Smokeless tobacco: Never  Vaping Use   Vaping status: Never Used  Substance and Sexual Activity   Alcohol use: No   Drug use: No   Sexual activity: Not on file    Comment: Depo shot on 11/21/2023  Other Topics Concern   Not on file  Social History Narrative   Not on file   Social Drivers of Health   Financial Resource Strain: Not on file  Food Insecurity: Not on file  Transportation Needs: Not on file  Physical Activity: Not on file  Stress: Not on file  Social Connections: Not on file     Family History: The patient's family history includes ADD / ADHD in her mother and sister; Allergic rhinitis in her mother; Anemia in her mother; Asthma in her sister; Depression in her father; Hypercholesterolemia in her father; Hyperthyroidism in her mother; Migraines in her mother; Sleep apnea in her father. There is no  history of Colon cancer, Esophageal cancer, Stomach cancer, or Rectal cancer.  ROS:   Please see the history of present illness.     All other systems reviewed and are negative.  EKGs/Labs/Other Studies Reviewed:    The following studies were reviewed today: EKG Interpretation Date/Time:  Monday June 14 2024 10:52:57 EDT Ventricular Rate:  67 PR Interval:  132 QRS Duration:  78 QT Interval:  398 QTC Calculation: 420 R Axis:   61  Text Interpretation: Normal sinus rhythm Normal ECG No previous ECGs available Confirmed by [place], Shaman Muscarella [phone]) on 06/14/2024 11:02:38 AM   EKG Interpretation Date/Time:  Monday June 14 2024 10:52:57 EDT Ventricular Rate:  67 PR Interval:  132 QRS Duration:  78 QT Interval:  398 QTC Calculation: 420 R Axis:   61  Text Interpretation: Normal sinus  rhythm Normal ECG No previous ECGs available Confirmed by [place], Shyhiem Beeney [phone]) on 06/14/2024 11:02:38 AM    Recent Labs: No results found for requested labs within last 365 days.  Recent Lipid Panel No results found for: CHOL, TRIG, HDL, CHOLHDL, VLDL, LDLCALC, LDLDIRECT   Risk Assessment/Calculations:      Physical Exam:    VS:  Ht 5' 8 (1.727 m)   Wt 142 lb 6.4 oz (64.6 kg)   SpO2 99%   BMI 21.65 kg/m     Wt Readings from Last 3 Encounters:  06/14/24 142 lb 6.4 oz (64.6 kg) (75%, Z= 0.67)*  01/12/24 135 lb (61.2 kg) (67%, Z= 0.43)*  01/06/24 135 lb 6 oz (61.4 kg) (67%, Z= 0.45)*   * Growth percentiles are based on CDC (Girls, 2-20 Years) data.    Orthostatic BP check Lying: 110/60 HR 65 Sitting 112/70 HR 75 Standing 108/82 HR 76 Standing 3 minutes. 120/64 HR 75  GEN:  Well nourished, well developed in no acute distress HEENT: Normal NECK: No JVD; No carotid bruits LYMPHATICS: No lymphadenopathy CARDIAC: RRR, no murmurs, rubs, gallops RESPIRATORY:  Clear to auscultation without rales, wheezing or rhonchi  ABDOMEN: Soft, non-tender, non-distended MUSCULOSKELETAL:  No edema; No deformity  SKIN: Warm and dry NEUROLOGIC:  Alert and oriented x 3 PSYCHIATRIC:  Normal affect   ASSESSMENT:    1. Syncope, unspecified syncope type    PLAN:    In order of problems listed above:  Pre syncope - suspect symptoms mainly vasovagal. Not orthostatic. Doubt POTS. Normal cardiac exam and Ecg. Counseled on staying well hydrated and liberal salt intake. Should use Apple watch to monitor rhythm if symptoms recur. Maintain healthy diet- avoid prolonged fasting. No further cardiac testing recommended. Can follow up PRN           Medication Adjustments/Labs and Tests Ordered: Current medicines are reviewed at length with the patient today.  Concerns regarding medicines are outlined above.  Orders Placed This Encounter  Procedures   EKG 12-Lead   No orders of the  defined types were placed in this encounter.   Patient Instructions  Medication Instructions:  Continue same  Lab Work: None ordered  Testing/Procedures: None ordered  Follow-Up: At [facility], you and your health needs are our priority.  As part of our continuing mission to provide you with exceptional heart care, our providers are all part of one team.  This team includes your primary Cardiologist (physician) and Advanced Practice Providers or APPs (Physician Assistants and Nurse Practitioners) who all work together to provide you with the care you need, when you need it.  Your next appointment:  As Needed  Provider:  Dr.Zakaree Mcclenahan   We recommend signing up for the patient portal called MyChart.  Sign up information is provided on this After Visit Summary.  MyChart is used to connect with patients for Virtual Visits (Telemedicine).  Patients are able to view lab/test results, encounter notes, upcoming appointments, etc.  Non-urgent messages can be sent to your provider as well.   To learn more about what you can do with MyChart, go to [email or web address].       Signed, Bryann Gentz [place], MD  06/14/2024 11:24 AM    [facility]

## 2024-06-14 ENCOUNTER — Ambulatory Visit: Payer: Self-pay | Attending: Cardiology | Admitting: Cardiology

## 2024-06-14 ENCOUNTER — Encounter: Payer: Self-pay | Admitting: Cardiology

## 2024-06-14 VITALS — Ht 68.0 in | Wt 142.4 lb

## 2024-06-14 DIAGNOSIS — R55 Syncope and collapse: Secondary | ICD-10-CM | POA: Diagnosis not present

## 2024-06-14 DIAGNOSIS — R1011 Right upper quadrant pain: Secondary | ICD-10-CM | POA: Diagnosis not present

## 2024-06-14 DIAGNOSIS — R17 Unspecified jaundice: Secondary | ICD-10-CM | POA: Diagnosis not present

## 2024-06-14 NOTE — Patient Instructions (Signed)
 Medication Instructions:  Continue same   Lab Work: None ordered  Testing/Procedures: None ordered  Follow-Up: At [facility], you and your health needs are our priority.  As part of our continuing mission to provide you with exceptional heart care, our providers are all part of one team.  This team includes your primary Cardiologist (physician) and Advanced Practice Providers or APPs (Physician Assistants and Nurse Practitioners) who all work together to provide you with the care you need, when you need it.  Your next appointment:  As Needed    Provider:  Dr.Jordan   We recommend signing up for the patient portal called "MyChart".  Sign up information is provided on this After Visit Summary.  MyChart is used to connect with patients for Virtual Visits (Telemedicine).  Patients are able to view lab/test results, encounter notes, upcoming appointments, etc.  Non-urgent messages can be sent to your provider as well.   To learn more about what you can do with MyChart, go to [email or web address].

## 2024-07-07 DIAGNOSIS — Z113 Encounter for screening for infections with a predominantly sexual mode of transmission: Secondary | ICD-10-CM | POA: Diagnosis not present

## 2024-07-07 DIAGNOSIS — N3000 Acute cystitis without hematuria: Secondary | ICD-10-CM | POA: Diagnosis not present

## 2024-08-13 DIAGNOSIS — R300 Dysuria: Secondary | ICD-10-CM | POA: Diagnosis not present

## 2024-10-16 DIAGNOSIS — B2790 Infectious mononucleosis, unspecified without complication: Secondary | ICD-10-CM | POA: Diagnosis not present

## 2024-10-16 DIAGNOSIS — R059 Cough, unspecified: Secondary | ICD-10-CM | POA: Diagnosis not present

## 2024-10-16 DIAGNOSIS — J9809 Other diseases of bronchus, not elsewhere classified: Secondary | ICD-10-CM | POA: Diagnosis not present

## 2024-11-06 DIAGNOSIS — J029 Acute pharyngitis, unspecified: Secondary | ICD-10-CM | POA: Diagnosis not present

## 2024-11-13 DIAGNOSIS — M79672 Pain in left foot: Secondary | ICD-10-CM | POA: Diagnosis not present

## 2024-11-13 DIAGNOSIS — M7989 Other specified soft tissue disorders: Secondary | ICD-10-CM | POA: Diagnosis not present
